# Patient Record
Sex: Female | Born: 1970 | Race: White | Hispanic: No | Smoking: Never smoker
Health system: Southern US, Community
[De-identification: ages and names within clinical notes are randomized; demographics above are authoritative.]

## PROBLEM LIST (undated history)

## (undated) DIAGNOSIS — M810 Age-related osteoporosis without current pathological fracture: Secondary | ICD-10-CM

## (undated) HISTORY — DX: Age-related osteoporosis without current pathological fracture: M81.0

## (undated) HISTORY — PX: OTHER SURGICAL HISTORY: SHX169

---

## 1996-06-11 HISTORY — PX: BREAST BIOPSY: SHX20

## 2012-11-04 ENCOUNTER — Ambulatory Visit: Payer: Self-pay | Admitting: Family Medicine

## 2014-11-16 ENCOUNTER — Other Ambulatory Visit: Payer: Self-pay | Admitting: Family Medicine

## 2014-11-16 DIAGNOSIS — M8580 Other specified disorders of bone density and structure, unspecified site: Secondary | ICD-10-CM

## 2014-11-16 DIAGNOSIS — Z13820 Encounter for screening for osteoporosis: Secondary | ICD-10-CM

## 2014-11-16 DIAGNOSIS — E559 Vitamin D deficiency, unspecified: Secondary | ICD-10-CM

## 2014-11-18 ENCOUNTER — Ambulatory Visit: Payer: Self-pay

## 2014-11-24 ENCOUNTER — Ambulatory Visit
Admission: RE | Admit: 2014-11-24 | Discharge: 2014-11-24 | Disposition: A | Discharge status: Home / Self Care | Payer: 59 | Source: Ambulatory Visit | Attending: Family Medicine | Admitting: Family Medicine

## 2014-11-24 DIAGNOSIS — Z13820 Encounter for screening for osteoporosis: Secondary | ICD-10-CM | POA: Diagnosis present

## 2014-11-24 DIAGNOSIS — E559 Vitamin D deficiency, unspecified: Secondary | ICD-10-CM | POA: Diagnosis present

## 2014-11-24 DIAGNOSIS — M8580 Other specified disorders of bone density and structure, unspecified site: Secondary | ICD-10-CM | POA: Insufficient documentation

## 2015-07-27 ENCOUNTER — Encounter: Payer: Self-pay | Admitting: *Deleted

## 2015-07-27 ENCOUNTER — Ambulatory Visit: Payer: Self-pay | Admitting: General Surgery

## 2015-08-04 ENCOUNTER — Ambulatory Visit (INDEPENDENT_AMBULATORY_CARE_PROVIDER_SITE_OTHER): Payer: BLUE CROSS/BLUE SHIELD | Admitting: General Surgery

## 2015-08-04 ENCOUNTER — Encounter: Payer: Self-pay | Admitting: General Surgery

## 2015-08-04 VITALS — BP 118/74 | HR 74 | Resp 12 | Ht 68.0 in | Wt 126.0 lb

## 2015-08-04 DIAGNOSIS — Z1239 Encounter for other screening for malignant neoplasm of breast: Secondary | ICD-10-CM

## 2015-08-04 DIAGNOSIS — N6452 Nipple discharge: Secondary | ICD-10-CM

## 2015-08-04 NOTE — Progress Notes (Signed)
Patient ID: Meghan Hicks, female   DOB: Jun 08, 1971, 45 y.o.   MRN: [id number]  Chief Complaint  Patient presents with  . Other    left breast discharge    HPI Meghan Hicks is a 45 y.o. female here today for a evaluation of left breast discharge and tenderness. Patient states this started after she stopped breastfeeding over a year and half ago. She has had some discharge at random times in her left breast. Denies pain, swelling, redness, or other breast symptoms. I have reviewed the history of present illness with the patient.   HPI  Past Medical History  Diagnosis Date  . Osteoporosis     Past Surgical History  Procedure Laterality Date  . Breast mass excsion  1197    History reviewed. No pertinent family history.  Social History Social History  Substance Use Topics  . Smoking status: Never Smoker   . Smokeless tobacco: None  . Alcohol Use: No    Allergies  Allergen Reactions  . Sulfa Antibiotics     Current Outpatient Prescriptions  Medication Sig Dispense Refill  . cholecalciferol (VITAMIN D) 1000 units tablet Take 1,000 Units by mouth 2 (two) times daily.     No current facility-administered medications for this visit.    Review of Systems Review of Systems  Constitutional: Negative.   Respiratory: Negative.   Cardiovascular: Negative.     Blood pressure 118/74, pulse 74, resp. rate 12, height  (1.727 m), weight 126 lb (57.153 kg), last menstrual period 07/14/2015.  Physical Exam Physical Exam  Constitutional: She is oriented to person, place, and time. She appears well-developed and well-nourished.  Eyes: Conjunctivae are normal. No scleral icterus.  Neck: Neck supple.  Cardiovascular: Normal rate, regular rhythm and normal heart sounds.   Pulmonary/Chest: Breath sounds normal. Right breast exhibits no inverted nipple, no mass, no nipple discharge, no skin change and no tenderness. Left breast exhibits tenderness. Left breast exhibits no  inverted nipple, no mass, no nipple discharge and no skin change.  Abdominal: Soft. Bowel sounds are normal. There is no tenderness.  Lymphadenopathy:    She has no cervical adenopathy.    She has no axillary adenopathy.  Neurological: She is alert and oriented to person, place, and time.  Skin: Skin is warm and dry.    Data Reviewed Prior notes.  Assessment  No findings on exam to account for intermittent drainage from left nipple. Exam otherwise stable.      Plan    Plan to order bilateral screening mammogram now. Patient will also be placed in recalls for bilateral screening mammogram in one year and follow up after.      PCP:  Estell Harpin  This information has been scribed by Milas Kocher, CMA    Kieth Brightly 08/08/2015, 3:12 PM

## 2015-08-04 NOTE — Patient Instructions (Addendum)
Continue self breast exams. Call office for any new breast issues or concerns. Plan to order bilateral screening mammogram. Patient will be placed in recalls for bilateral screening mammogram in one year and follow up after.

## 2015-08-08 ENCOUNTER — Encounter: Payer: Self-pay | Admitting: General Surgery

## 2015-08-18 ENCOUNTER — Ambulatory Visit
Admission: RE | Admit: 2015-08-18 | Discharge: 2015-08-18 | Disposition: A | Discharge status: Home / Self Care | Payer: BLUE CROSS/BLUE SHIELD | Source: Ambulatory Visit | Attending: General Surgery | Admitting: General Surgery

## 2015-08-18 ENCOUNTER — Ambulatory Visit: Payer: Self-pay

## 2015-08-18 DIAGNOSIS — Z1231 Encounter for screening mammogram for malignant neoplasm of breast: Secondary | ICD-10-CM | POA: Insufficient documentation

## 2015-08-18 DIAGNOSIS — Z1239 Encounter for other screening for malignant neoplasm of breast: Secondary | ICD-10-CM

## 2015-08-22 ENCOUNTER — Other Ambulatory Visit: Payer: Self-pay | Admitting: General Surgery

## 2015-08-22 DIAGNOSIS — N63 Unspecified lump in unspecified breast: Secondary | ICD-10-CM

## 2016-07-10 ENCOUNTER — Other Ambulatory Visit: Payer: Self-pay

## 2016-07-10 DIAGNOSIS — N6452 Nipple discharge: Secondary | ICD-10-CM

## 2016-08-08 ENCOUNTER — Ambulatory Visit: Payer: BLUE CROSS/BLUE SHIELD

## 2016-08-08 ENCOUNTER — Other Ambulatory Visit: Payer: BLUE CROSS/BLUE SHIELD

## 2016-08-20 ENCOUNTER — Telehealth: Payer: Self-pay | Admitting: *Deleted

## 2016-08-20 NOTE — Telephone Encounter (Signed)
Left message for patient to call the office back regarding her appointment on 08/21/16. She did not have her mammogram so appointment needs to be rescheduled with Dr.Sankar once she schedules her mammogram.

## 2016-08-21 ENCOUNTER — Ambulatory Visit: Payer: BLUE CROSS/BLUE SHIELD | Admitting: General Surgery

## 2016-09-24 ENCOUNTER — Telehealth: Payer: Self-pay | Admitting: General Surgery

## 2016-09-24 NOTE — Telephone Encounter (Signed)
UNABLE TO LEAVE MESSAGE FOR PATIENT.NEED TO SEE IF PATIENT CAN MOVE UP IN THE DAY ON 09-24-16.IF SHE IS UNABLE TO ,PLEASE CHECK WITH MAUREEN TO SEE IF SHE WANTS [place] TO RESCHEDULE TO ANOTHER DAY.

## 2016-09-26 ENCOUNTER — Ambulatory Visit: Payer: BLUE CROSS/BLUE SHIELD | Admitting: General Surgery

## 2016-10-11 ENCOUNTER — Encounter: Payer: Self-pay | Admitting: *Deleted

## 2016-10-15 ENCOUNTER — Telehealth: Payer: Self-pay | Admitting: *Deleted

## 2016-10-15 NOTE — Telephone Encounter (Signed)
Patient called in wanting to make sure her appointments for follow up with Dr. Evette CristalSankar have been cancelled.   This patient states she is having trouble with her insurance this year and wants to defer screening mammogram and office visit to April 2019.

## 2016-10-15 NOTE — Telephone Encounter (Signed)
I contacted patient today and made her aware that since she is wanting to postpone mammograms till next year that Dr. Evette CristalSankar would like to see her for an office visit follow up.   Patient states she is self pay at this time and won't have insurance until next year. She is aware of the discount for self pay patients and aware she can contact the [employer] program to see if she qualifies for assistance.  The patient will check her schedule and call the office back later this week to arrange a time for follow up.

## 2016-10-18 ENCOUNTER — Encounter: Payer: Self-pay | Admitting: *Deleted

## 2016-10-22 ENCOUNTER — Telehealth: Payer: Self-pay | Admitting: *Deleted

## 2016-10-22 ENCOUNTER — Encounter: Payer: Self-pay | Admitting: *Deleted

## 2016-10-22 NOTE — Telephone Encounter (Signed)
Called patient today but no answer and not able to leave a message.   My Chart message sent.   We need to arrange an appointment with Dr. Evette CristalSankar for follow up.

## 2016-10-24 ENCOUNTER — Encounter: Payer: Self-pay | Admitting: *Deleted

## 2016-10-24 NOTE — Telephone Encounter (Signed)
Letter mailed to the patient

## 2019-09-19 ENCOUNTER — Ambulatory Visit: Payer: BLUE CROSS/BLUE SHIELD | Attending: Internal Medicine

## 2019-09-19 DIAGNOSIS — Z23 Encounter for immunization: Secondary | ICD-10-CM

## 2019-09-19 NOTE — Progress Notes (Signed)
   Covid-19 Vaccination Clinic  Name:  CARLISS PORCARO    MRN: [id number] DOB: [birth year]  09/19/2019  Ms. Nayak was observed post Covid-19 immunization for 15 minutes without incident. She was provided with Vaccine Information Sheet and instruction to access the V-Safe system.   Ms. Finck was instructed to call 911 with any severe reactions post vaccine: Marland Kitchen Difficulty breathing  . Swelling of face and throat  . A fast heartbeat  . A bad rash all over body  . Dizziness and weakness   Immunizations Administered    Name Date Dose VIS Date Route   Pfizer COVID-19 Vaccine 09/19/2019  1:21 PM 0.3 mL 05/22/2019 Intramuscular   Manufacturer: [employer], [employer]   Lot: [id number]   NDC: [address]-3

## 2019-09-21 ENCOUNTER — Ambulatory Visit: Payer: BLUE CROSS/BLUE SHIELD

## 2019-10-14 ENCOUNTER — Ambulatory Visit: Payer: BLUE CROSS/BLUE SHIELD | Attending: Internal Medicine

## 2019-11-30 ENCOUNTER — Telehealth: Payer: BLUE CROSS/BLUE SHIELD | Admitting: Family

## 2019-11-30 DIAGNOSIS — R399 Unspecified symptoms and signs involving the genitourinary system: Secondary | ICD-10-CM

## 2019-11-30 DIAGNOSIS — R109 Unspecified abdominal pain: Secondary | ICD-10-CM

## 2019-11-30 NOTE — Progress Notes (Signed)
Based on what you shared with me, I feel your condition warrants further evaluation and I recommend that you be seen for a face to face office visit.  Given your having UTI symptoms with back pain you need to be seen face-to-face to rule out a more serious infection.   NOTE: If you entered your credit card information for this eVisit, you will not be charged. You may see a "hold" on your card for the $35 but that hold will drop off and you will not have a charge processed.   If you are having a true medical emergency please call 911.      For an urgent face to face visit, [place] has five urgent care centers for your convenience:      NEW:  [facility] at [facility] Directions [phone] [address] [place], [place] [address] . 10 am - 6pm Monday - Friday    [facility] Center [facility]) Get Driving Directions [phone] [address] [place], [place] [address] . 10 am to 8 pm Monday-Friday . 12 pm to 8 pm [facility] Urgent Care at [facility] Get Driving Directions [phone] [address], [address] [place], [place] [address] . 8 am to 8 pm Monday-Friday . 9 am to 6 pm Saturday . 11 am to 6 pm Sunday     [facility] at [facility] Get Driving Directions  [phone] [address] [place], [place] [address] . 8 am to 8 pm Monday-Friday . 8 am to 4 pm [facility] Urgent Care at [facility] Directions [phone] [address] [place], [place] [address] . 12 pm to 6 pm Monday-Friday      Your e-visit answers were reviewed by a board certified advanced clinical practitioner to complete your personal care plan.  Thank you for using e-Visits.
